# Patient Record
Sex: Male | Born: 1975 | Race: Black or African American | Hispanic: No | Marital: Single | Smoking: Current every day smoker
Health system: Southern US, Community
[De-identification: ages and names within clinical notes are randomized; demographics above are authoritative.]

## PROBLEM LIST (undated history)

## (undated) DIAGNOSIS — F32A Depression, unspecified: Secondary | ICD-10-CM

## (undated) DIAGNOSIS — F329 Major depressive disorder, single episode, unspecified: Secondary | ICD-10-CM

---

## 1999-10-27 ENCOUNTER — Ambulatory Visit (HOSPITAL_BASED_OUTPATIENT_CLINIC_OR_DEPARTMENT_OTHER): Admission: RE | Admit: 1999-10-27 | Discharge: 1999-10-27 | Payer: Self-pay | Admitting: Orthopedic Surgery

## 2000-04-26 ENCOUNTER — Ambulatory Visit (HOSPITAL_BASED_OUTPATIENT_CLINIC_OR_DEPARTMENT_OTHER): Admission: RE | Admit: 2000-04-26 | Discharge: 2000-04-26 | Payer: Self-pay | Admitting: Orthopedic Surgery

## 2003-11-27 ENCOUNTER — Emergency Department (HOSPITAL_COMMUNITY): Admission: EM | Admit: 2003-11-27 | Discharge: 2003-11-27 | Payer: Self-pay | Admitting: Emergency Medicine

## 2003-11-29 ENCOUNTER — Emergency Department (HOSPITAL_COMMUNITY): Admission: EM | Admit: 2003-11-29 | Discharge: 2003-11-30 | Payer: Self-pay | Admitting: Emergency Medicine

## 2003-12-01 ENCOUNTER — Emergency Department (HOSPITAL_COMMUNITY): Admission: EM | Admit: 2003-12-01 | Discharge: 2003-12-01 | Payer: Self-pay | Admitting: Emergency Medicine

## 2005-03-30 ENCOUNTER — Emergency Department (HOSPITAL_COMMUNITY): Admission: EM | Admit: 2005-03-30 | Discharge: 2005-03-30 | Payer: Self-pay | Admitting: Emergency Medicine

## 2005-04-29 ENCOUNTER — Emergency Department (HOSPITAL_COMMUNITY): Admission: EM | Admit: 2005-04-29 | Discharge: 2005-04-29 | Payer: Self-pay | Admitting: Emergency Medicine

## 2006-05-03 ENCOUNTER — Emergency Department (HOSPITAL_COMMUNITY): Admission: EM | Admit: 2006-05-03 | Discharge: 2006-05-03 | Payer: Self-pay | Admitting: Family Medicine

## 2012-03-06 ENCOUNTER — Encounter (HOSPITAL_COMMUNITY): Payer: Self-pay | Admitting: *Deleted

## 2012-03-06 ENCOUNTER — Emergency Department (HOSPITAL_COMMUNITY)
Admission: EM | Admit: 2012-03-06 | Discharge: 2012-03-07 | Disposition: A | Discharge status: Other Acute Inpatient Hospital | Payer: Medicaid Other | Source: Home / Self Care | Attending: Emergency Medicine | Admitting: Emergency Medicine

## 2012-03-06 DIAGNOSIS — F3289 Other specified depressive episodes: Secondary | ICD-10-CM | POA: Insufficient documentation

## 2012-03-06 DIAGNOSIS — F329 Major depressive disorder, single episode, unspecified: Secondary | ICD-10-CM | POA: Insufficient documentation

## 2012-03-06 DIAGNOSIS — T1491XA Suicide attempt, initial encounter: Secondary | ICD-10-CM

## 2012-03-06 DIAGNOSIS — R45851 Suicidal ideations: Secondary | ICD-10-CM | POA: Insufficient documentation

## 2012-03-06 DIAGNOSIS — F322 Major depressive disorder, single episode, severe without psychotic features: Secondary | ICD-10-CM | POA: Diagnosis present

## 2012-03-06 DIAGNOSIS — F17200 Nicotine dependence, unspecified, uncomplicated: Secondary | ICD-10-CM | POA: Insufficient documentation

## 2012-03-06 DIAGNOSIS — X789XXA Intentional self-harm by unspecified sharp object, initial encounter: Secondary | ICD-10-CM | POA: Insufficient documentation

## 2012-03-06 HISTORY — DX: Depression, unspecified: F32.A

## 2012-03-06 HISTORY — DX: Major depressive disorder, single episode, unspecified: F32.9

## 2012-03-06 LAB — COMPREHENSIVE METABOLIC PANEL
ALT: 12 U/L (ref 0–53)
Albumin: 3.9 g/dL (ref 3.5–5.2)
Alkaline Phosphatase: 87 U/L (ref 39–117)
BUN: 8 mg/dL (ref 6–23)
Chloride: 100 mEq/L (ref 96–112)
Glucose, Bld: 86 mg/dL (ref 70–99)
Potassium: 3.7 mEq/L (ref 3.5–5.1)
Total Bilirubin: 0.4 mg/dL (ref 0.3–1.2)

## 2012-03-06 LAB — ETHANOL: Alcohol, Ethyl (B): <11 mg/dL (ref 0–11)

## 2012-03-06 LAB — CBC
HCT: 42.1 % (ref 39.0–52.0)
Hemoglobin: 14.6 g/dL (ref 13.0–17.0)
RDW: 13 % (ref 11.5–15.5)
WBC: 13.6 10*3/uL — ABNORMAL HIGH (ref 4.0–10.5)

## 2012-03-06 LAB — ACETAMINOPHEN LEVEL: Acetaminophen (Tylenol), Serum: <15 ug/mL (ref 10–30)

## 2012-03-06 MED ORDER — ONDANSETRON HCL 8 MG PO TABS: 4.0000 mg | ORAL_TABLET | Freq: Three times a day (TID) | ORAL | Status: DC | PRN

## 2012-03-06 MED ORDER — ALUM & MAG HYDROXIDE-SIMETH 200-200-20 MG/5ML PO SUSP: 30.0000 mL | ORAL | Status: DC | PRN

## 2012-03-06 MED ORDER — IBUPROFEN 200 MG PO TABS: 600.0000 mg | ORAL_TABLET | Freq: Three times a day (TID) | ORAL | Status: DC | PRN

## 2012-03-06 MED ORDER — NICOTINE 21 MG/24HR TD PT24: 21.0000 mg | MEDICATED_PATCH | Freq: Every day | TRANSDERMAL | Status: DC
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
## 2012-03-06 NOTE — ED Notes (Signed)
House coverage called sitter at 11:00, security paged for waunding.

## 2012-03-06 NOTE — ED Notes (Signed)
Belongings in locker #3 

## 2012-03-06 NOTE — ED Notes (Signed)
Pt states that he is ready for everything to be over. Pt states that he tried to cut himself with a razor blade this afternoon but he couldn't hit his vessel. Pt has an abbrassion to his right inner elbow., pt bleeding controlled without bandage. Pt deneis HI. Pt just wants someone to talk to about mental health issues.

## 2012-03-06 NOTE — ED Notes (Signed)
Patient transported to room C22 with this RN.

## 2012-03-06 NOTE — ED Provider Notes (Signed)
History     CSN: [id number]  Arrival date & time 03/06/12  2120   None     Chief Complaint  Patient presents with  . Suicidal    (Consider location/radiation/quality/duration/timing/severity/associated sxs/prior treatment) HPI History provided by pt.   Pt attempted suicide but cutting right antecubital space w/ razor today.  Stopped in the process because of the pain.  Has had SI for a very long time but wife recently cheated on him which pushed him over the edge.  Denies HI.  Has taken zoloft for depression in the past but is not currently medicated and has not been diagnosed w/ any other psychiatric illnesses.  Drinks and uses cocaine occasionally.   Past Medical History  Diagnosis Date  . Depression     History reviewed. No pertinent past surgical history.  History reviewed. No pertinent family history.  History  Substance Use Topics  . Smoking status: Current Every Day Smoker  . Smokeless tobacco: Not on file  . Alcohol Use: Yes      Review of Systems  All other systems reviewed and are negative.    Allergies  Review of patient's allergies indicates no known allergies.  Home Medications  No current outpatient prescriptions on file.  BP 133/95  Pulse 97  Temp 98.5 F (36.9 C) (Oral)  Resp 20  SpO2 95%  Physical Exam  Nursing note and vitals reviewed. Constitutional: He is oriented to person, place, and time. He appears well-developed and well-nourished. No distress.  HENT:  Head: Normocephalic and atraumatic.  Eyes:       Normal appearance  Neck: Normal range of motion.  Pulmonary/Chest: Effort normal.  Musculoskeletal: Normal range of motion.  Neurological: He is alert and oriented to person, place, and time.  Psychiatric: His behavior is normal.       Flat affect.  Pt makes very little eye contact    ED Course  Procedures (including critical care time)  Labs Reviewed  CBC - Abnormal; Notable for the following:    WBC 13.6 (*)     All  other components within normal limits  SALICYLATE LEVEL - Abnormal; Notable for the following:    Salicylate Lvl <2.0 (*)     All other components within normal limits  ACETAMINOPHEN LEVEL  COMPREHENSIVE METABOLIC PANEL  ETHANOL  URINE RAPID DRUG SCREEN (HOSP PERFORMED)   No results found.   1. Suicide attempt       MDM  36yo M presents w/ c/o suicide attempt.  Cleared medically, moved to POD C, ACT team consulted, holding orders written.  Dr. Rulon Abide aware of pt.          Otilio Miu, [place] 03/07/12 [phone]

## 2012-03-07 ENCOUNTER — Inpatient Hospital Stay (HOSPITAL_COMMUNITY)
Admission: EM | Admit: 2012-03-07 | Discharge: 2012-03-09 | DRG: 881 | Disposition: A | Discharge status: Home / Self Care | Payer: Medicaid Other | Source: Ambulatory Visit | Attending: Psychiatry | Admitting: Psychiatry

## 2012-03-07 ENCOUNTER — Encounter (HOSPITAL_COMMUNITY): Payer: Self-pay | Admitting: Behavioral Health

## 2012-03-07 DIAGNOSIS — R45851 Suicidal ideations: Secondary | ICD-10-CM

## 2012-03-07 DIAGNOSIS — F322 Major depressive disorder, single episode, severe without psychotic features: Secondary | ICD-10-CM | POA: Diagnosis present

## 2012-03-07 DIAGNOSIS — F329 Major depressive disorder, single episode, unspecified: Principal | ICD-10-CM

## 2012-03-07 DIAGNOSIS — F3289 Other specified depressive episodes: Principal | ICD-10-CM | POA: Diagnosis present

## 2012-03-07 DIAGNOSIS — Z23 Encounter for immunization: Secondary | ICD-10-CM

## 2012-03-07 DIAGNOSIS — Z79899 Other long term (current) drug therapy: Secondary | ICD-10-CM

## 2012-03-07 LAB — RAPID URINE DRUG SCREEN, HOSP PERFORMED
Barbiturates: NOT DETECTED
Benzodiazepines: NOT DETECTED
Tetrahydrocannabinol: NOT DETECTED

## 2012-03-07 MED ORDER — SERTRALINE HCL 25 MG PO TABS
25.0000 mg | ORAL_TABLET | Freq: Every day | ORAL | Status: DC
  Administered 2012-03-07 – 2012-03-09 (×3): 25 mg via ORAL
  Filled 2012-03-07 (×2): qty 1
  Filled 2012-03-07: qty 3
  Filled 2012-03-07 (×3): qty 1

## 2012-03-07 MED ORDER — ALUM & MAG HYDROXIDE-SIMETH 200-200-20 MG/5ML PO SUSP: 30.0000 mL | ORAL | Status: DC | PRN

## 2012-03-07 MED ORDER — MAGNESIUM HYDROXIDE 400 MG/5ML PO SUSP: 30.0000 mL | Freq: Every day | ORAL | Status: DC | PRN

## 2012-03-07 MED ORDER — INFLUENZA VIRUS VACC SPLIT PF IM SUSP
0.5000 mL | INTRAMUSCULAR | Status: AC
  Administered 2012-03-08: 0.5 mL via INTRAMUSCULAR

## 2012-03-07 MED ORDER — ACETAMINOPHEN 325 MG PO TABS
650.0000 mg | ORAL_TABLET | Freq: Four times a day (QID) | ORAL | Status: DC | PRN
Start: 2012-03-07 — End: 2012-03-09

## 2012-03-07 MED ORDER — TRAZODONE HCL 50 MG PO TABS: 50.0000 mg | ORAL_TABLET | Freq: Every evening | ORAL | Status: DC | PRN

## 2012-03-07 MED ORDER — ACETAMINOPHEN 325 MG PO TABS: 650.0000 mg | ORAL_TABLET | Freq: Four times a day (QID) | ORAL | Status: DC | PRN

## 2012-03-07 NOTE — Progress Notes (Signed)
Psychoeducational Group Note  Date:  03/07/2012 Time:  2000  Group Topic/Focus:  Wrap-Up Group:   The focus of this group is to help patients review their daily goal of treatment and discuss progress on daily workbooks.  Participation Level:  Minimal  Participation Quality:  Attentive  Affect:  Appropriate  Cognitive:  Appropriate  Insight:  Limited  Engagement in Group:  Limited  Additional Comments:  Patient shared that he had not been in the hospital for very long but that he looked forward to groups tomorrow.  Kashden Deboy, [place] 03/07/2012, 10:27 PM
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
## 2012-03-07 NOTE — [facility] Suicide Risk Assessment (Signed)
Suicide Risk Assessment  Admission Assessment     Nursing information obtained from:  Patient Demographic factors:  Male, african [place], married, unemployed Current Mental Status:   Patient upset, tearful over his life. However denies active suicidal thoughts, denies AH/VH/HI. Loss Factors:   Loss of relationship with wife Historical Factors:   Depressed mood Risk Reduction Factors:   Has 4 children that he cares about  CLINICAL FACTORS:   Depression:   Anhedonia Hopelessness Impulsivity Insomnia  COGNITIVE FEATURES THAT CONTRIBUTE TO RISK:  Thought constriction (tunnel vision)    SUICIDE RISK:   Mild:  Suicidal ideation of limited frequency, intensity, duration, and specificity.  There are no identifiable plans, no associated intent, mild dysphoria and related symptoms, good self-control (both objective and subjective assessment), few other risk factors, and identifiable protective factors, including available and accessible social support.  PLAN OF CARE: Start antidepressant medication. Continue to assess for SI.  Kyle Vance 03/07/2012, 1:38 PM

## 2012-03-07 NOTE — ED Notes (Addendum)
Pt resting in bed, watching TV, no distress noted, awaiting ACT team consult. Sitter remains at bedside.

## 2012-03-07 NOTE — Tx Team (Signed)
Initial Interdisciplinary Treatment Plan  PATIENT STRENGTHS: (choose at least two) Ability for insight General fund of knowledge Motivation for treatment/growth Physical Health  PATIENT STRESSORS: Educational concerns Marital or family conflict   PROBLEM LIST: Problem List/Patient Goals Date to be addressed Date deferred Reason deferred Estimated date of resolution  Suicidal Ideations      Depression                                                 DISCHARGE CRITERIA:  Motivation to continue treatment in a less acute level of care Need for constant or close observation no longer present  PRELIMINARY DISCHARGE PLAN: Attend aftercare/continuing care group Outpatient therapy  PATIENT/FAMIILY INVOLVEMENT: This treatment plan has been presented to and reviewed with the patient, Kyle Vance, and/or family member, .  The patient and family have been given the opportunity to ask questions and make suggestions.  Noah Charon 03/07/2012, 4:05 PM

## 2012-03-07 NOTE — BH Assessment (Signed)
[facility] Assessment Progress Note      Pt has been accepted for inpatient psychiatric treatment to Dr. Daleen Bo.  All support paperwork completed and faxed to [facility].  Dr. Jeraldine Loots and nursing staff notified and agreeable with disposition.  Awaiting transfer.

## 2012-03-07 NOTE — ED Provider Notes (Signed)
Medical screening examination/treatment/procedure(s) were performed by non-physician practitioner and as supervising physician I was immediately available for consultation/collaboration.  Jones Skene, M.D.   r   Jones Skene, MD 03/07/12 [phone]
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
## 2012-03-07 NOTE — ED Notes (Signed)
Report received, assumed care.  
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
## 2012-03-08 NOTE — Progress Notes (Signed)
Patient ID: Kyle Vance, male   DOB: [birth year], 36 y.o.   MRN: [id number] D: Pt. Reports "I'm cool"  "they going to let me out tomorrow."  Pt. Reports he's gained some coping skills and it going to get therapy upon discharge. A: Pt. Encouraged to go to [employer]. Staff will monitor for safety q38min.  Writer encouraged pt. To approach marital conflict with caution and to consider counseling. R: Pt. Is safe on the unit. Pt. Attends karaoke. Pt. Is still considering what to do about marriage, but children is of the utmost importance.

## 2012-03-08 NOTE — Progress Notes (Signed)
Patient ID: Kyle Vance, male   DOB: 1976-02-11, 36 y.o.   MRN: [id number] D: Pt. Under the impression he would be discharged tonight. "I signed myself in voluntarily though." Pt. Denies SHI, reports put razor to arm to cut self "I just couldn't do it, couldn't go deep"  Pt. Concerned about test he is scheduled to take in the morning. 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  Pt. Reports does want to miss any more classes. Pt. Reports family issues but does not elaborate "but she gone now, she packed up and left."  A: Staff will monitor q73min for safety. Pt.encouraged to go to group. Writer encouraged pt. To speak with physician in a.m. About discharge and ask CM about getting a letter to the school so he is not dropped from the program. R: Pt. Attended group. Pt. Is safe on the unit. Pt. Plans to speak with physician in a.m.

## 2012-03-08 NOTE — Progress Notes (Signed)
Psychoeducational Group Note  Date:  03/08/2012 Time: 1100  Group Topic/Focus:  Rediscovering Joy:   The focus of this group is to explore various ways to relieve stress in a positive manner.  Participation Level:  Active  Participation Quality:  Appropriate, Attentive and Sharing  Affect:  Appropriate  Cognitive:  Appropriate  Insight:  Good  Engagement in Group:  Good  Additional Comments:  Patient was able to explain different ways humor and laughter have positive benefits for both mind and body. Patient was able to identify one thing that is pleasurable to patient within the five senses in a category and use the information as coping skills when needed.  Karleen Hampshire Brittini 03/08/2012, 3:14 PM

## 2012-03-08 NOTE — Progress Notes (Signed)
Psychoeducational Group Note  Date:  03/08/2012 Time:  1000  Group Topic/Focus:  Wellness Toolbox:   The focus of this group is to discuss various aspects of wellness, balancing those aspects and exploring ways to increase the ability to experience wellness.  Patients will create a wellness toolbox for use upon discharge.  Participation Level:  Active  Participation Quality:  Appropriate, Attentive, Sharing and Supportive  Affect:  Appropriate  Cognitive:  Alert and Appropriate  Insight:  Good  Engagement in Group:  Good  Additional Comments:  Productive group  Earline Mayotte 03/08/2012, 6:21 PM
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
## 2012-03-08 NOTE — Treatment Plan (Signed)
Interdisciplinary Treatment Plan Update (Adult)  Date: 03/08/2012  Time Reviewed: 9:33 AM   Progress in Treatment: Attending groups: Yes Participating in groups: Yes Taking medication as prescribed: Yes Tolerating medication: Yes   Family/Significant other contact made:  Yes Patient understands diagnosis:  Yes  As evidenced by asking for help with depression, increase coping skills Discussing patient identified problems/goals with staff:  Yes See below Medical problems stabilized or resolved:  Yes Denies suicidal/homicidal ideation: Yes  In AM group Issues/concerns per patient self-inventory:  None  Rates depression and hopelessness a 1 Other:  New problem(s) identified: N/A  Reason for Continuation of Hospitalization: Medication stabilization  Interventions implemented related to continuation of hospitalization:  Zoloft trial  Encourage group attendance and participation  Additional comments:  Estimated length of stay: 1-2 days  Discharge Plan: return home, follow up outpt  New goal(s): N/A  Review of initial/current patient goals per problem list:    1.  Goal(s):Eliminate SI  Met:  Yes  Target date:10/31  As evidenced by Self report  2.  Goal (s): Stabilize mood  Met:  Yes  Target date:10/31  As evidenced by:Will rate depression and anxiety at 3 or less  3.  Goal(s): Set up for outpt services  Met:  Yes  Target date:10/31  As evidenced JY:[id number] of appointments  4.  Goal(s):  Met:  Yes  Target date:  As evidenced by:  Attendees: Patient:     Family:     Physician: Dr Daleen Bo 03/08/2012 9:33 AM   Nursing:  Berneice Heinrich  03/08/2012 9:33 AM   Clinical Social Worker:  Richelle Ito 03/08/2012 9:33 AM   Extender:   03/08/2012 9:33 AM   Other:     Other:     Other:     Other:      Scribe for Treatment Team:   Ida Rogue, 03/08/2012 9:33 AM

## 2012-03-08 NOTE — Progress Notes (Signed)
[facility] MD Progress Note  03/08/2012 10:37 AM Kyle Vance  MRN:  [id number]  S: Patient seen. Reports doing okay, spoke to his family last night. Tolerating Zoloft well, denies side effects. Denies SI.  Diagnosis:   Axis I: Depressive Disorder NOS Axis II: No diagnosis Axis III:  Past Medical History  Diagnosis Date  . Depression    Axis IV: economic problems, housing problems and occupational problems Axis V: 51-60 moderate symptoms  ADL's:  Intact  Sleep: Fair  Appetite:  Fair  Mental Status Examination/Evaluation: Objective:  Appearance: Disheveled  Patent attorney::  Fair  Speech:  Clear and Coherent  Volume:  Normal  Mood:  Anxious  Affect:  Blunt  Thought Process:  Coherent  Orientation:  Full  Thought Content:  WDL  Suicidal Thoughts:  No  Homicidal Thoughts:  No  Memory:  Immediate;   Fair Recent;   Fair Remote;   Fair  Judgement:  Fair  Insight:  Fair  Psychomotor Activity:  Normal  Concentration:  Fair  Recall:  Fair  Akathisia:  No  Handed:  Right  AIMS (if indicated):     Assets:  Communication Skills Desire for Improvement  Sleep:  Number of Hours: 5.5    Vital Signs:Blood pressure 95/63, pulse 87, temperature 98.1 F (36.7 C), temperature source Oral, resp. rate 16, height 5\' 11"  (1.803 m), weight 106.142 kg (234 lb). Current Medications: Current Facility-Administered Medications  Medication Dose Route Frequency Provider Last Rate Last Dose  . acetaminophen (TYLENOL) tablet 650 mg  650 mg Oral Q6H PRN Norval Gable, NP      . alum & mag hydroxide-simeth (MAALOX/MYLANTA) 200-200-20 MG/5ML suspension 30 mL  30 mL Oral Q4H PRN Norval Gable, NP      . influenza  inactive virus vaccine (FLUZONE/FLUARIX) injection 0.5 mL  0.5 mL Intramuscular Tomorrow-1000 Namya Voges, MD      . magnesium hydroxide (MILK OF MAGNESIA) suspension 30 mL  30 mL Oral Daily PRN Norval Gable, NP      . sertraline (ZOLOFT) tablet 25 mg  25 mg Oral Daily  Franklyn Cafaro, MD   25 mg at 03/08/12 0730  . traZODone (DESYREL) tablet 50 mg  50 mg Oral QHS PRN Tarik Teixeira, MD      . DISCONTD: acetaminophen (TYLENOL) tablet 650 mg  650 mg Oral Q6H PRN Arseniy Toomey, MD      . DISCONTD: alum & mag hydroxide-simeth (MAALOX/MYLANTA) 200-200-20 MG/5ML suspension 30 mL  30 mL Oral Q4H PRN Lynley Killilea, MD      . DISCONTD: magnesium hydroxide (MILK OF MAGNESIA) suspension 30 mL  30 mL Oral Daily PRN Kathren Scearce, MD       Facility-Administered Medications Ordered in Other Encounters  Medication Dose Route Frequency Provider Last Rate Last Dose  . DISCONTD: alum & mag hydroxide-simeth (MAALOX/MYLANTA) 200-200-20 MG/5ML suspension 30 mL  30 mL Oral PRN Otilio Miu, PA      . DISCONTD: ibuprofen (ADVIL,MOTRIN) tablet 600 mg  600 mg Oral Q8H PRN Otilio Miu, PA      . DISCONTD: nicotine (NICODERM CQ - dosed in mg/24 hours) patch 21 mg  21 mg Transdermal Daily Otilio Miu, PA      . DISCONTD: ondansetron (ZOFRAN) tablet 4 mg  4 mg Oral Q8H PRN Otilio Miu, PA        Lab Results:  Results for orders placed during the hospital encounter of 03/06/12 (from the past 48 hour(s))  ACETAMINOPHEN LEVEL  Status: Normal   Collection Time   03/06/12 10:20 PM      Component Value Range Comment   Acetaminophen (Tylenol), Serum <15.0  10 - 30 ug/mL   CBC     Status: Abnormal   Collection Time   03/06/12 10:20 PM      Component Value Range Comment   WBC 13.6 (*) 4.0 - 10.5 K/uL    RBC 4.74  4.22 - 5.81 MIL/uL    Hemoglobin 14.6  13.0 - 17.0 g/dL    HCT 16.1  09.6 - 04.5 %    MCV 88.8  78.0 - 100.0 fL    MCH 30.8  26.0 - 34.0 pg    MCHC 34.7  30.0 - 36.0 g/dL    RDW 40.9  81.1 - 91.4 %    Platelets 337  150 - 400 K/uL   COMPREHENSIVE METABOLIC PANEL     Status: Normal   Collection Time   03/06/12 10:20 PM      Component Value Range Comment   Sodium 137  135 - 145 mEq/L    Potassium 3.7  3.5 - 5.1 mEq/L     Chloride 100  96 - 112 mEq/L    CO2 25  19 - 32 mEq/L    Glucose, Bld 86  70 - 99 mg/dL    BUN 8  6 - 23 mg/dL    Creatinine, Ser 7.82  0.50 - 1.35 mg/dL    Calcium 9.2  8.4 - 95.6 mg/dL    Total Protein 7.5  6.0 - 8.3 g/dL    Albumin 3.9  3.5 - 5.2 g/dL    AST 12  0 - 37 U/L    ALT 12  0 - 53 U/L    Alkaline Phosphatase 87  39 - 117 U/L    Total Bilirubin 0.4  0.3 - 1.2 mg/dL    GFR calc non Af Amer >90  >90 mL/min    GFR calc Af Amer >90  >90 mL/min   ETHANOL     Status: Normal   Collection Time   03/06/12 10:20 PM      Component Value Range Comment   Alcohol, Ethyl (B) <11  0 - 11 mg/dL   SALICYLATE LEVEL     Status: Abnormal   Collection Time   03/06/12 10:20 PM      Component Value Range Comment   Salicylate Lvl <2.0 (*) 2.8 - 20.0 mg/dL   URINE RAPID DRUG SCREEN (HOSP PERFORMED)     Status: Normal   Collection Time   03/07/12 12:05 AM      Component Value Range Comment   Opiates NONE DETECTED  NONE DETECTED    Cocaine NONE DETECTED  NONE DETECTED    Benzodiazepines NONE DETECTED  NONE DETECTED    Amphetamines NONE DETECTED  NONE DETECTED    Tetrahydrocannabinol NONE DETECTED  NONE DETECTED    Barbiturates NONE DETECTED  NONE DETECTED     Physical Findings: AIMS: Facial and Oral Movements Muscles of Facial Expression: None, normal Lips and Perioral Area: None, normal Jaw: None, normal Tongue: None, normal,Extremity Movements Upper (arms, wrists, hands, fingers): None, normal Lower (legs, knees, ankles, toes): None, normal, Trunk Movements Neck, shoulders, hips: None, normal, Overall Severity Severity of abnormal movements (highest score from questions above): None, normal Incapacitation due to abnormal movements: None, normal Patient's awareness of abnormal movements (rate only patient's report): No Awareness, Dental Status Current problems with teeth and/or dentures?: No Does patient usually wear  dentures?: No  CIWA:    COWS:     Treatment Plan  Summary: Daily contact with patient to assess and evaluate symptoms and progress in treatment Medication management  Plan: Continue current plan of care. Plan for discharge tomorrow with appropriate follow up appointments.  [name] 03/08/2012, 10:37 AM

## 2012-03-08 NOTE — Discharge Planning (Signed)
Pt attended morning aftercare planning group.Pt displayed appropriate affect and was attentive and insightful during group. He reported that it was his idea to come to hospital and is seeking help with learning better coping skills and working on self-esteem issues stemming from negative childhood experiences with father. Pt said that he kicked wife out of home this past Sat after her alleged infidelity. He lives with grandmother and daughter and is a Consulting civil engineer at [employer]). Pt reported that this is his first time in a behavioral health hospital. PSA completed. Pt signed consent for SW to speak with his mother. Pt open to receiving therapy services at [facility] and will follow up at [facility]. Message left for intake person at [facility] to schedule appointment with therapist. Pt would also like a letter for school explaining his absence.  Kyle Vance attended PM group.  He entered late, and did not contribute to the discussion.

## 2012-03-08 NOTE — Progress Notes (Signed)
Psychoeducational Group Note  Date:  03/08/2012 Time:  2100   Group Topic/Focus:  Karaoke  Participation Level:  Minimal  Participation Quality:  Attentive  Affect:  Appropriate  Cognitive:  Appropriate  Insight:  Good  Engagement in Group:  Limited  Additional Comments:    Humberto Seals Monique 03/08/2012, 10:11 PM

## 2012-03-08 NOTE — Progress Notes (Addendum)
[facility] Adult Inpatient Family/Significant Other Suicide Prevention Education  Suicide Prevention Education:  Education Completed; [place] Daily,  Mother, [phone], has been identified by the patient as the family member/significant other with whom the patient will be residing, and identified as the person(s) who will aid the patient in the event of a mental health crisis (suicidal ideations/suicide attempt).  With written consent from the patient, the family member/significant other has been provided the following suicide prevention education, prior to the and/or following the discharge of the patient.  The suicide prevention education provided includes the following:  Suicide risk factors  Suicide prevention and interventions  National Suicide Hotline telephone number  [facility] assessment telephone number  [facility] Emergency Assistance 911  [facility] and/or Residential Mobile Crisis Unit telephone number  Request made of family/significant other to:  Remove weapons (e.g., guns, rifles, knives), all items previously/currently identified as safety concern.    Remove drugs/medications (over-the-counter, prescriptions, illicit drugs), all items previously/currently identified as a safety concern.  The family member/significant other verbalizes understanding of the suicide prevention education information provided.  The family member/significant other agrees to remove the items of safety concern listed above.  Kyle Vance does not live with his mother, but did not give [place] permission to contact grandmother, with whom he lives.  Kyle Vance knows her son well, and informed me that part of what she believes is going on is that she just learned that her cancer has come back, and she believes that weighs heavily on Kyle Vance's mind as well.  Daryel Gerald B 03/08/2012, 5:38 PM

## 2012-03-08 NOTE — Progress Notes (Signed)
  D) Patient quiet but  cooperative upon my assessment. Patient states slept "well," and  appetite is "good." Patient rates depression as   1/10, patient rates hopeless feelings as 1 /10. Patient denies SI/HI, denies A/V hallucinations. Patient states "I am ready to get back to my life, I don't want to hurt myself I have too many things going right for me."   A) Patient offered support and encouragement, patient encouraged to discuss feelings/concerns with staff. Patient verbalized understanding. Patient monitored Q15 minutes for safety. Patient met with MD to discuss today's goals and plan of care.  R) Patient active on unit, attending groups in day room and meals in dining room.  Patient insightful with a plan to "keep working on coping skills and find someone to talk to" once he is discharged from [facility]. Patient taking medications as ordered. Will continue to monitor.

## 2012-03-09 MED ORDER — SERTRALINE HCL 25 MG PO TABS: 25.0000 mg | ORAL_TABLET | Freq: Every day | ORAL | Status: DC

## 2012-03-09 NOTE — Progress Notes (Signed)
Patient denies SI/HI, denies A/V hallucinations. Patient verbalizes understanding of discharge instructions, follow up care and prescriptions. Patient given all belongings from [facility] locker. Patient escorted out by staff, transported by family. 

## 2012-03-09 NOTE — Progress Notes (Signed)
Psychoeducational Group Note  Date:  03/09/2012 Time:  1100  Group Topic/Focus:  Early Warning Signs:   The focus of this group is to help patients identify signs or symptoms they exhibit before slipping into an unhealthy state or crisis.  Participation Level:  Active  Participation Quality:  Appropriate and Attentive  Affect:  Appropriate  Cognitive:  Alert, Appropriate and Oriented  Insight:  Good  Engagement in Group:  Good  Additional Comments:    Noah Charon 03/09/2012, 2:15 PM

## 2012-03-09 NOTE — Progress Notes (Signed)
[facility] Case Management Discharge Plan:  Will you be returning to the same living situation after discharge: Yes,  home with family At discharge, do you have transportation home?:Yes,  family Do you have the ability to pay for your medications:Yes,  mental health  Interagency Information:     Release of information consent forms completed and in the chart;  Patient's signature needed at discharge.  Patient to Follow up at:  Follow-up Information    Follow up with [facility]. (Walk-in between 8am-9am Monday thru Friday for your hospital follow up appointment.  this is where you will see a Dr for your medication)    Contact information:   [address], [place] [address] [phone] fax [phone]      Follow up with [facility]. On 03/15/2012. (Appointment at 3pm to see Tamera Punt (therapist). Please arrive by 2:45pm to fill out paperwork. Make sure to call after you discharge to confirm appointment.)    Contact information:   [facility] [address] [place] [address] [phone] fax [phone]         Patient denies SI/HI:   Yes,  yes    Safety Planning and Suicide Prevention discussed:  Yes,  yes  Barrier to discharge identified:No.  Summary and Recommendations:   Kyle Vance 03/09/2012, 10:57 AM

## 2012-03-09 NOTE — [facility] Suicide Risk Assessment (Signed)
Suicide Risk Assessment  Discharge Assessment     Demographic Factors:  Male and Unemployed  Mental Status Per Nursing Assessment::   On Admission:     Current Mental Status by Physician: Patient alert and oriented to 4. Mood improved. Denies SI/HI/AH/VH.  Loss Factors: Decrease in vocational status, Loss of significant relationship and Financial problems/change in socioeconomic status  Historical Factors: Impulsivity  Risk Reduction Factors:   Responsible for children under 59 years of age, Sense of responsibility to family and Positive coping skills or problem solving skills  Continued Clinical Symptoms:  Depression:   Recent sense of peace/wellbeing  Cognitive Features That Contribute To Risk:  Cognitively intact  Suicide Risk:  Minimal: No identifiable suicidal ideation.  Patients presenting with no risk factors but with morbid ruminations; may be classified as minimal risk based on the severity of the depressive symptoms  Discharge Diagnoses:   AXIS I:  Depressive Disorder NOS AXIS II:  No diagnosis AXIS III:   Past Medical History  Diagnosis Date  . Depression    AXIS IV:  economic problems, housing problems and occupational problems AXIS V:  61-70 mild symptoms  Plan Of Care/Follow-up recommendations:  Activity:  normal Diet:  normal  Is patient on multiple antipsychotic therapies at discharge:  No   Has Patient had three or more failed trials of antipsychotic monotherapy by history:  No  Recommended Plan for Multiple Antipsychotic Therapies: NA  Bran Aldridge 03/09/2012, 10:37 AM

## 2012-03-12 NOTE — Progress Notes (Signed)
Patient Discharge Instructions:  After Visit Summary (AVS):   Faxed to:  03/12/2012 Psychiatric Admission Assessment Note:   Faxed to:  03/12/2012 Suicide Risk Assessment - Discharge Assessment:   Faxed to:  03/12/2012 Faxed/Sent to the Next Level Care provider:  03/12/2012  Faxed to [facility] @ [phone] And to [facility] @ [phone]  Wandra Scot, 03/12/2012, 4:31 PM

## 2012-03-13 NOTE — Discharge Summary (Signed)
Physician Discharge Summary Note  Patient:  Kyle Vance is an 36 y.o., male MRN:  [id number] DOB:  Jan 11, 1976 Patient phone:  [phone] (home)  Patient address:   [address] [place] [address]   Date of Admission:  03/07/2012 Date of Discharge: 03/09/2012  Discharge Diagnoses: Depressive d/o nos   Axis Diagnosis: AXIS I: Depressive Disorder NOS  AXIS II: No diagnosis  AXIS III:  Past Medical History   Diagnosis  Date   .  Depression     AXIS IV: economic problems, occupational problems and other psychosocial or environmental problems  AXIS V: 51-60 moderate symptoms   Level of Care:  Inpatient Hospitalization.  Reason for hospitalization: Pt came to [facility] because he had cut himself with a knife in an effort "to cut a main blood vessel and bleed to death." Patient admits that purpose of cutting was to kill self. Patient had been waiting to meet someone yesterday. When that person did not show up patient went into the woods and made a cut to himself.Moved recently from [place] for a better life. Patient and wife are recently separated over the last few days. Patient feels that he fails at everything that he attempts. He states, "I get tired of losing." Patient denies any HI or A/V hallucinations. 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 He reports that neither of these substances are used on a regular basis. Patient has no outpatient psychiatric care and no previous inpatient history. He does talk a lot about the way that his father treats him and puts him down. Patient states that the reason for separation from spouse is because of infidelity.  Hospital Course:   The patient attended treatment team meeting this am and met with treatment team members. The patient's symptoms, treatment plan and response to treatment was discussed. The patient endorsed that their symptoms have improved. The patient also stated that they felt stable for discharge.  They reported  that from this hospital stay they had learned many coping skills.  In other to maintain their psychiatric stability, they will continue psychiatric care on an outpatient basis. They will follow-up as outlined below.  In addition they were instructed  to take all your medications as prescribed by their mental healthcare provider and to report any adverse effects and or reactions from your medicines to their outpatient provider promptly.  The patient is also instructed and cautioned to not engage in alcohol and or illegal drug use while on prescription medicines.  In the event of worsening symptoms the patient is instructed to call the crisis hotline, 911 and or go to the nearest ED for appropriate evaluation and treatment of symptoms.   Also while a patient in this hospital, the patient received medication management for his psychiatric symptoms. They were ordered and received as outlined below:    Medication List     As of 03/13/2012 12:18 PM    TAKE these medications      Indication    sertraline 25 MG tablet   Commonly known as: ZOLOFT   Take 1 tablet (25 mg total) by mouth daily. For depression/anxiety    Indication: for depression/anxiety       They were also enrolled in group counseling sessions and activities in which they participated actively.       Follow-up Information    Follow up with [facility]. (Walk-in between 8am-9am Monday thru Friday for your hospital follow up appointment.  this is where you will  see a Dr for your medication)    Contact information:   [address], [place] [address] [phone] fax [phone]      Follow up with [facility]. On 03/15/2012. (Appointment at 3pm to see Tamera Punt (therapist). Please arrive by 2:45pm to fill out paperwork. Make sure to call after you discharge to confirm appointment.)    Contact information:   [facility] [address] [place] [address] [phone] fax [phone]          Upon discharge, patient adamantly denies suicidal, homicidal ideations, auditory, visual hallucinations and or delusional thinking. They left [facility] with all personal belongings via personal transportation in no apparent distress.  Consults:  Please see electronic medical record for details.  Significant Diagnostic Studies:  Please see electronic medical record for details.  Discharge Vitals:   Blood pressure 124/79, pulse 91, temperature 97.7 F (36.5 C), temperature source Oral, resp. rate 16, height 5\' 11"  (1.803 m), weight 106.142 kg (234 lb)..  Mental Status Exam: See Mental Status Examination and Suicide Risk Assessment completed by Attending Physician prior to discharge.  Discharge destination:  Home  Is patient on multiple antipsychotic therapies at discharge:  No  Has Patient had three or more failed trials of antipsychotic monotherapy by history: N/A Recommended Plan for Multiple Antipsychotic Therapies: N/A Discharge Orders    Future Orders Please Complete By Expires   Diet - low sodium heart healthy      Increase activity slowly          Medication List     As of 03/13/2012 12:18 PM    TAKE these medications      Indication    sertraline 25 MG tablet   Commonly known as: ZOLOFT   Take 1 tablet (25 mg total) by mouth daily. For depression/anxiety    Indication: for depression/anxiety           Follow-up Information    Follow up with [facility]. (Walk-in between 8am-9am Monday thru Friday for your hospital follow up appointment.  this is where you will see a Dr for your medication)    Contact information:   [address], [place] [address] [phone] fax [phone]      Follow up with [facility]. On 03/15/2012. (Appointment at 3pm to see Tamera Punt (therapist). Please arrive by 2:45pm to fill out paperwork. Make sure to call after you discharge to confirm appointment.)    Contact information:   [facility] [address] [place] [address] [phone] fax [phone]        Follow-up recommendations:   Activities: Resume typical activities Diet: Resume typical diet Other: Follow up with outpatient provider and report any side effects to out patient prescriber.  Comments:  Take all your medications as prescribed by your mental healthcare provider. Report any adverse effects and or reactions from your medicines to your outpatient provider promptly. Patient is instructed and cautioned to not engage in alcohol and or illegal drug use while on prescription medicines. In the event of worsening symptoms, patient is instructed to call the crisis hotline, 911 and or go to the nearest ED for appropriate evaluation and treatment of symptoms. Follow-up with your primary care provider for your other medical issues, concerns and or health care needs.  SignedPatrick North 03/13/2012 12:18 PM

## 2016-07-23 ENCOUNTER — Encounter (HOSPITAL_COMMUNITY): Payer: Self-pay

## 2016-07-23 ENCOUNTER — Emergency Department (HOSPITAL_COMMUNITY)
Admission: EM | Admit: 2016-07-23 | Discharge: 2016-07-24 | Disposition: A | Discharge status: Home / Self Care | Payer: Managed Care, Other (non HMO) | Attending: Emergency Medicine | Admitting: Emergency Medicine

## 2016-07-23 ENCOUNTER — Emergency Department (HOSPITAL_COMMUNITY): Payer: Managed Care, Other (non HMO)

## 2016-07-23 DIAGNOSIS — Y939 Activity, unspecified: Secondary | ICD-10-CM | POA: Insufficient documentation

## 2016-07-23 DIAGNOSIS — S8992XA Unspecified injury of left lower leg, initial encounter: Secondary | ICD-10-CM | POA: Diagnosis present

## 2016-07-23 DIAGNOSIS — F17210 Nicotine dependence, cigarettes, uncomplicated: Secondary | ICD-10-CM | POA: Diagnosis not present

## 2016-07-23 DIAGNOSIS — W19XXXA Unspecified fall, initial encounter: Secondary | ICD-10-CM

## 2016-07-23 DIAGNOSIS — Y929 Unspecified place or not applicable: Secondary | ICD-10-CM | POA: Diagnosis not present

## 2016-07-23 DIAGNOSIS — X509XXA Other and unspecified overexertion or strenuous movements or postures, initial encounter: Secondary | ICD-10-CM | POA: Diagnosis not present

## 2016-07-23 DIAGNOSIS — S86912A Strain of unspecified muscle(s) and tendon(s) at lower leg level, left leg, initial encounter: Secondary | ICD-10-CM | POA: Diagnosis not present

## 2016-07-23 DIAGNOSIS — S9002XA Contusion of left ankle, initial encounter: Secondary | ICD-10-CM

## 2016-07-23 DIAGNOSIS — Y990 Civilian activity done for income or pay: Secondary | ICD-10-CM | POA: Diagnosis not present

## 2016-07-23 MED ORDER — IBUPROFEN 400 MG PO TABS
600.0000 mg | ORAL_TABLET | Freq: Once | ORAL | Status: AC
  Administered 2016-07-23: 600 mg via ORAL
  Filled 2016-07-23: qty 1

## 2016-07-23 NOTE — Discharge Instructions (Signed)
Tylenol and motrin as needed for pain.  If you were given medicines take as directed.  If you are on coumadin or contraceptives realize their levels and effectiveness is altered by many different medicines.  If you have any reaction (rash, tongues swelling, other) to the medicines stop taking and see a physician.    If your blood pressure was elevated in the ER make sure you follow up for management with a primary doctor or return for chest pain, shortness of breath or stroke symptoms.  Please follow up as directed and return to the ER or see a physician for new or worsening symptoms.  Thank you. Vitals:   07/23/16 2019 07/23/16 2112  BP: 138/85 (!) 154/85  Pulse: 97 85  Resp: 16 16  Temp: 97.7 F (36.5 C)   TempSrc: Oral   SpO2: 98% 94%

## 2016-07-23 NOTE — ED Triage Notes (Addendum)
Pt complaining of L knee and L ankle pain x 3 days. Pt states fell, caught leg underneath self. Pt ambulatory at triage. Full ROM, no obvious swelling noted at triage.

## 2016-07-23 NOTE — ED Provider Notes (Signed)
[facility]-EMERGENCY DEPT Provider Note   CSN: [id number] Arrival date & time: 07/23/16  1948     History   Chief Complaint Chief Complaint  Patient presents with  . Leg Pain    HPI Kyle Vance is a 41 y.o. male.  Patient with no significant medical problems presents with left knee and left ankle pain since an injury at work properly 3 days ago. Patient fell awkwardly and twisted his left knee and injured his left ankle. Pain with walking. No other injuries.      Past Medical History:  Diagnosis Date  . Depression     Patient Active Problem List   Diagnosis Date Noted  . MDD (major depressive disorder), single episode, severe , no psychosis (HCC) 03/07/2012    History reviewed. No pertinent surgical history.     Home Medications    Prior to Admission medications   Not on File    Family History History reviewed. No pertinent family history.  Social History Social History  Substance Use Topics  . Smoking status: Current Every Day Smoker    Packs/day: 1.00    Types: Cigarettes  . Smokeless tobacco: Never Used  . Alcohol use 1.2 oz/week    2 Cans of beer per week     Allergies   Patient has no known allergies.   Review of Systems Review of Systems  Constitutional: Negative for fever.  Musculoskeletal: Negative for joint swelling.  Skin: Negative for rash.  Neurological: Negative for weakness.     Physical Exam Updated Vital Signs BP 119/69   Pulse 67   Temp 97.7 F (36.5 C) (Oral)   Resp 16   SpO2 97%   Physical Exam  Constitutional: He is oriented to person, place, and time. He appears well-developed and well-nourished.  HENT:  Head: Normocephalic and atraumatic.  Eyes: Right eye exhibits no discharge. Left eye exhibits no discharge.  Neck: Normal range of motion.  Cardiovascular: Normal rate and regular rhythm.   Pulmonary/Chest: Effort normal.  Musculoskeletal: He exhibits tenderness. He exhibits no edema or deformity.  Patient  has mild tenderness medial joint line on the left knee. No effusion. No ligament laxity with testing. Mild tenderness lateral malleoli of the left. No swelling to left leg compartment soft. Neurovascularly intact.  Neurological: He is alert and oriented to person, place, and time.  Skin: Skin is warm. No rash noted.  Psychiatric: He has a normal mood and affect.  Nursing note and vitals reviewed.    ED Treatments / Results  Labs (all labs ordered are listed, but only abnormal results are displayed) Labs Reviewed - No data to display  EKG  EKG Interpretation None       Radiology Dg Ankle Complete Left  Result Date: 07/23/2016 CLINICAL DATA:  Fall tonight at work, left knee and ankle pain EXAM: LEFT ANKLE COMPLETE - 3+ VIEW COMPARISON:  None. FINDINGS: There is no evidence of fracture, dislocation, or joint effusion. There is no evidence of arthropathy or other focal bone abnormality. Soft tissues are unremarkable. IMPRESSION: Negative. Electronically Signed   By: Burman Nieves M.D.   On: 07/23/2016 23:15   Dg Knee Complete 4 Views Left  Result Date: 07/23/2016 CLINICAL DATA:  Fall tonight at work, left knee and ankle pain EXAM: LEFT KNEE - COMPLETE 4+ VIEW COMPARISON:  None. FINDINGS: No evidence of fracture, dislocation, or joint effusion. No evidence of arthropathy or other focal bone abnormality. Soft tissues are unremarkable. IMPRESSION: Negative. Electronically Signed  By: Burman NievesWilliam  Stevens M.D.   On: 07/23/2016 23:14    Procedures Procedures (including critical care time)  Medications Ordered in ED Medications  ibuprofen (ADVIL,MOTRIN) tablet 600 mg (600 mg Oral Given 07/23/16 2239)     Initial Impression / Assessment and Plan / ED Course  I have reviewed the triage vital signs and the nursing notes.  Pertinent labs & imaging results that were available during my care of the patient were reviewed by me and considered in my medical decision making (see chart for  details).    Patient presents with clinical concern for bone contusion and mild meniscal/ligamentous injury. Patient can bear weight. Plan for screening x-rays and outpatient follow-up.  Results and differential diagnosis were discussed with the patient/parent/guardian. Xrays were independently reviewed by myself.  Close follow up outpatient was discussed, comfortable with the plan.   Medications  ibuprofen (ADVIL,MOTRIN) tablet 600 mg (600 mg Oral Given 07/23/16 2239)    Vitals:   07/23/16 2019 07/23/16 2112 07/23/16 2300  BP: 138/85 (!) 154/85 119/69  Pulse: 97 85 67  Resp: 16 16   Temp: 97.7 F (36.5 C)    TempSrc: Oral    SpO2: 98% 94% 97%    Final diagnoses:  Fall, initial encounter  Knee strain, left, initial encounter  Contusion of left ankle, initial encounter     Final Clinical Impressions(s) / ED Diagnoses   Final diagnoses:  Fall, initial encounter  Knee strain, left, initial encounter  Contusion of left ankle, initial encounter    New Prescriptions New Prescriptions   No medications on file     Blane OharaJoshua Marshayla Mitschke, MD 07/23/16 2319

## 2018-01-11 IMAGING — CR DG ANKLE COMPLETE 3+V*L*
3 series · 3 of 3 positions shown · non-contrast
Comparison: None.

CLINICAL DATA: Fall tonight at work, left knee and ankle pain

EXAM:
LEFT ANKLE COMPLETE - 3+ VIEW

[AP]
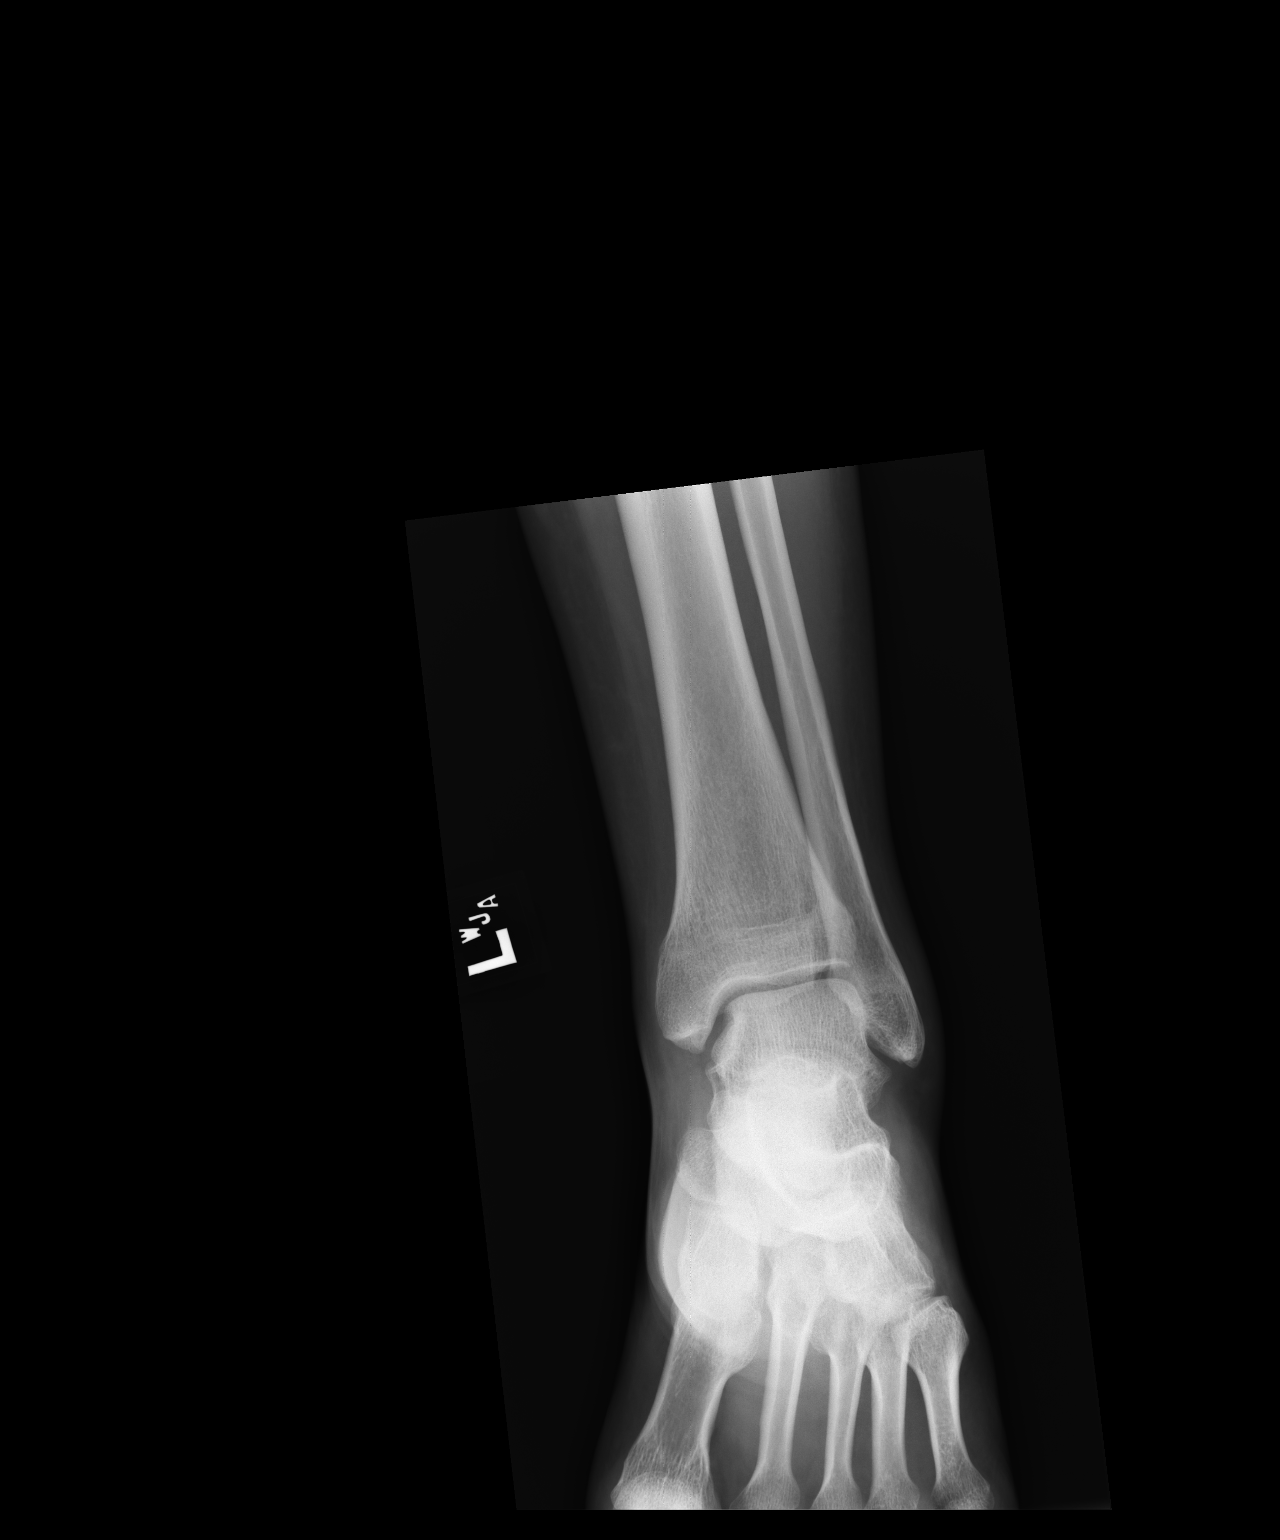

[ap obl int rot]
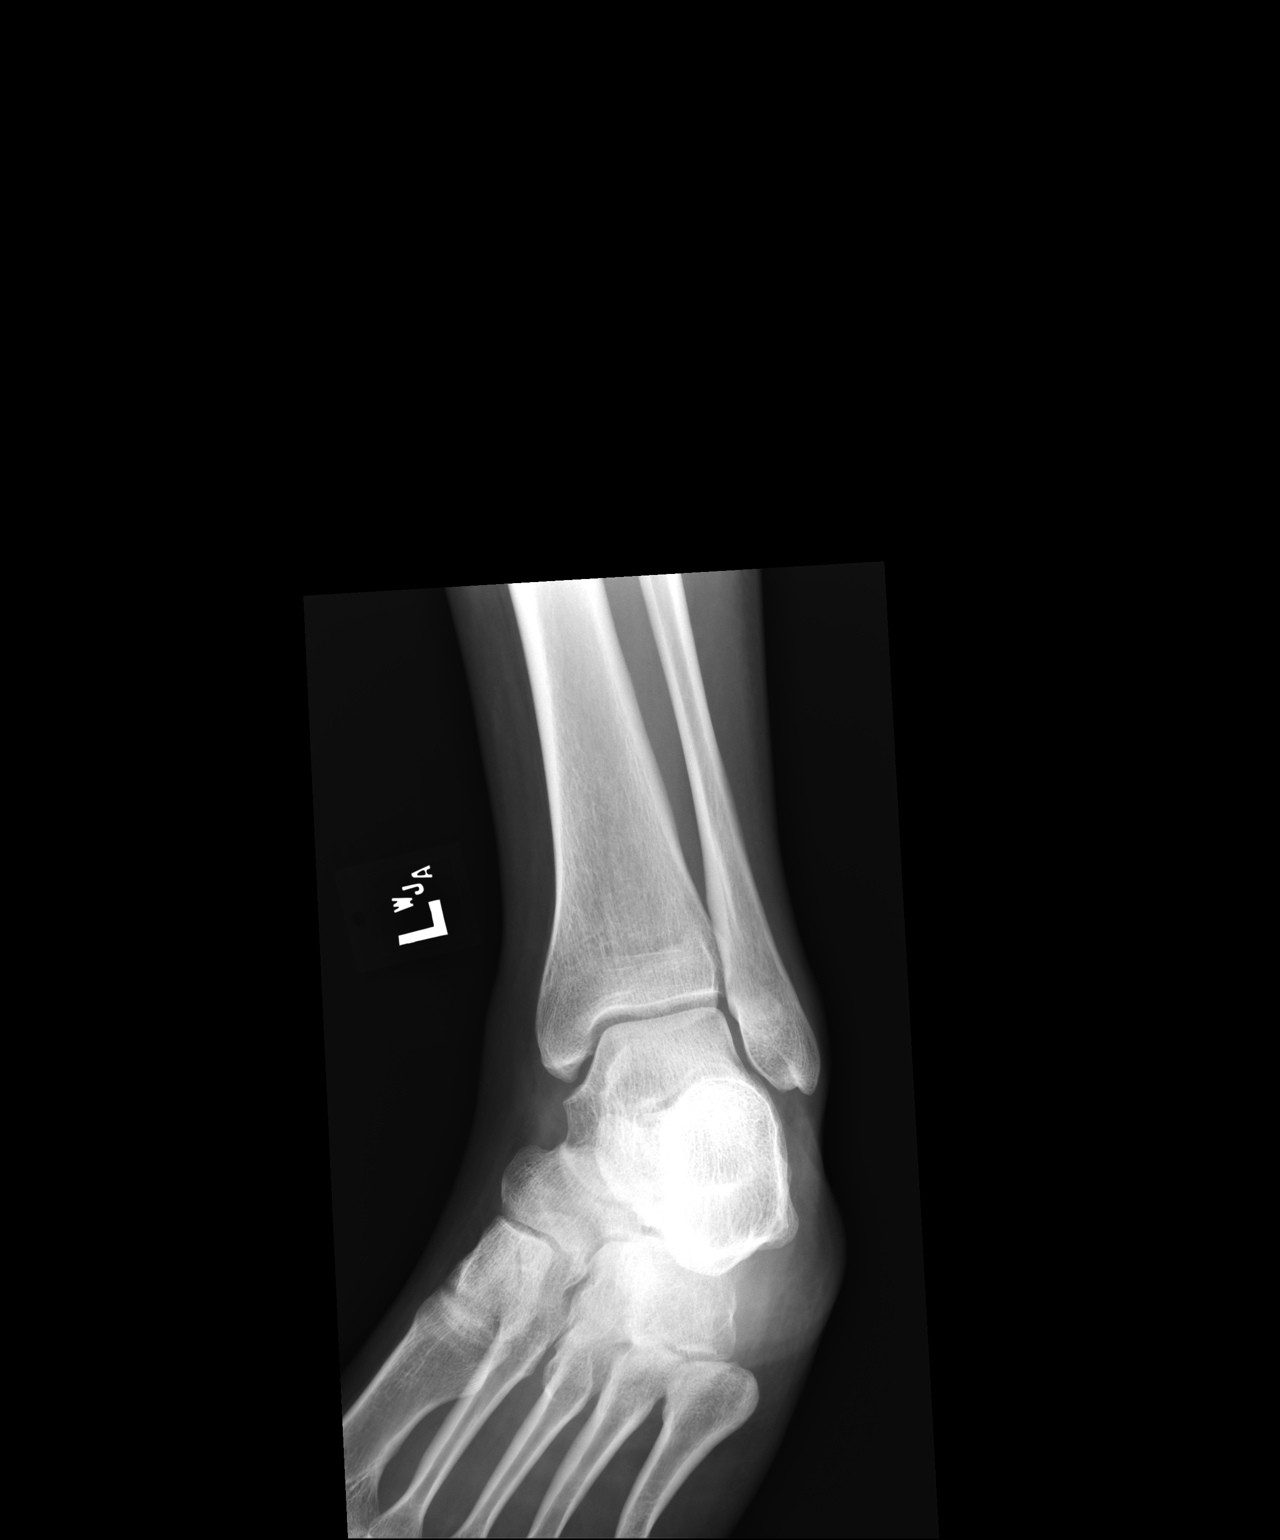

[lateral]
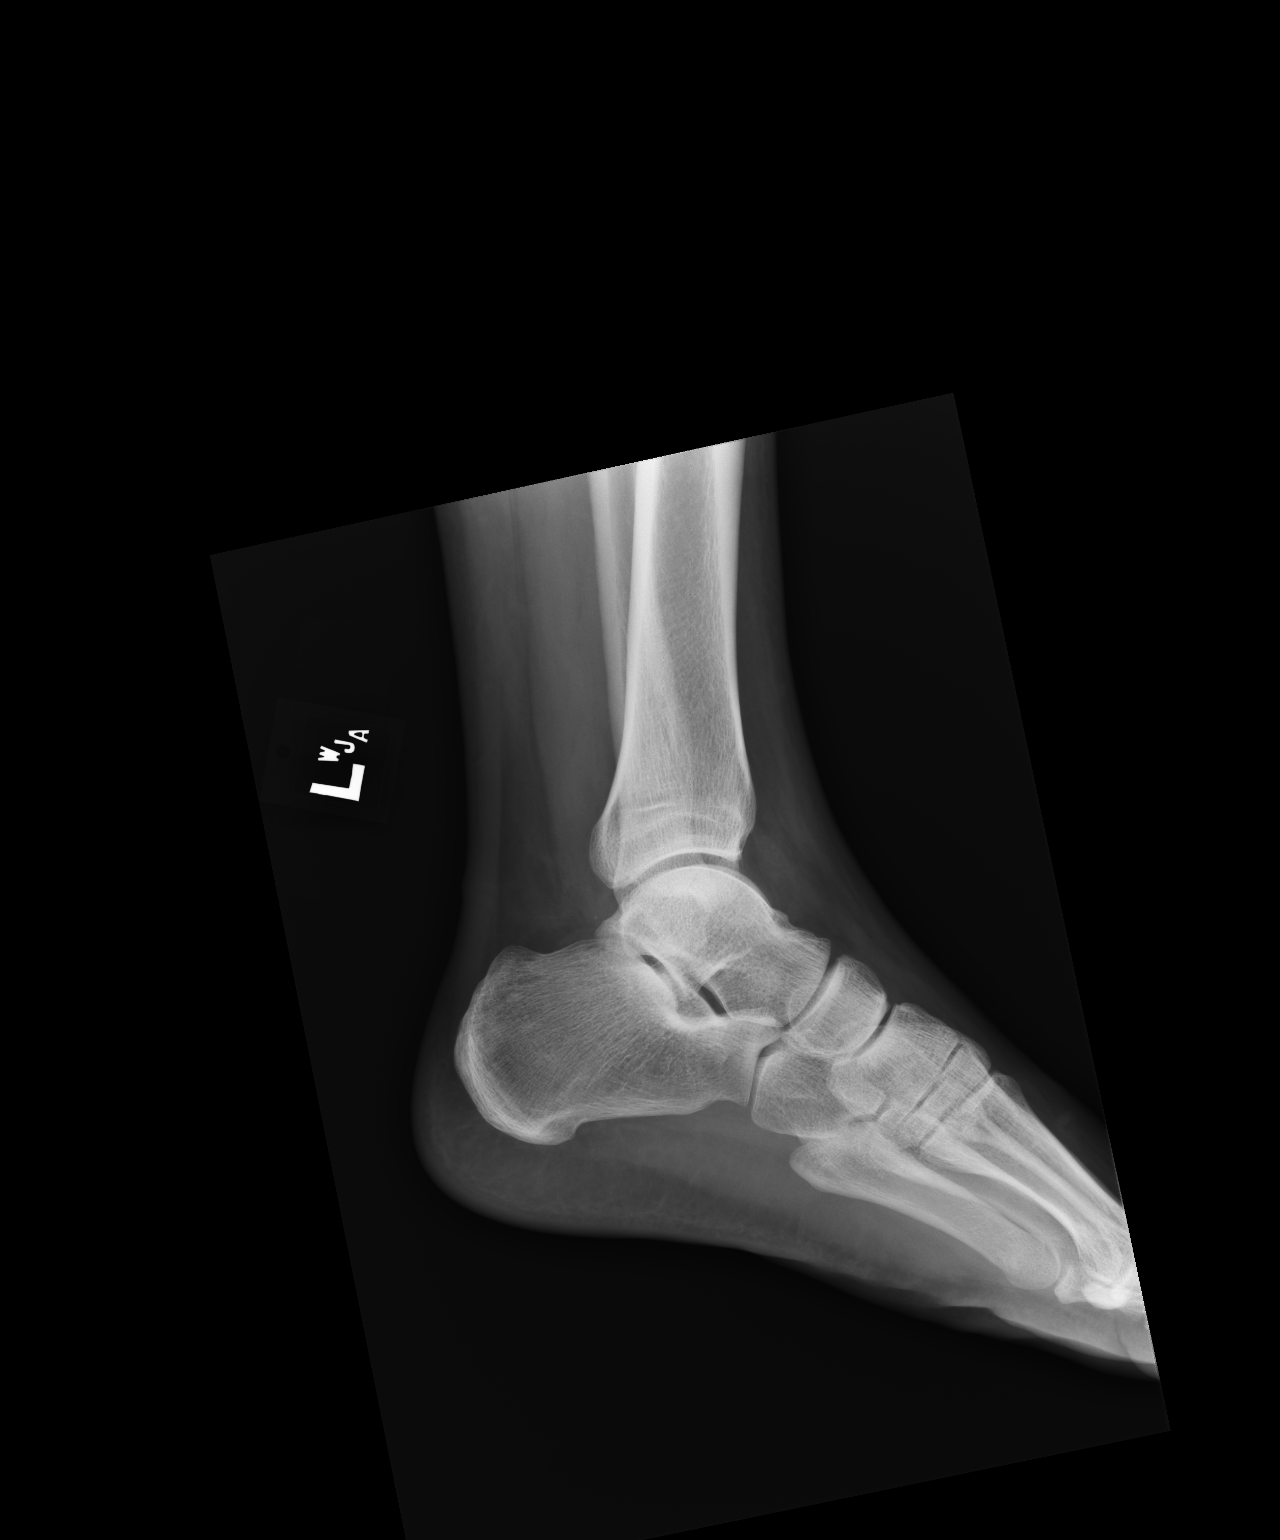

[3 of 3 positions shown; findings below may reference images not displayed]

FINDINGS: There is no evidence of fracture, dislocation, or joint effusion.
There is no evidence of arthropathy or other focal bone abnormality.
Soft tissues are unremarkable.
IMPRESSION: Negative.

## 2018-07-07 ENCOUNTER — Other Ambulatory Visit: Payer: Self-pay

## 2018-07-07 ENCOUNTER — Ambulatory Visit (HOSPITAL_COMMUNITY)
Admission: EM | Admit: 2018-07-07 | Discharge: 2018-07-07 | Disposition: A | Discharge status: Home / Self Care | Payer: Managed Care, Other (non HMO) | Attending: Family Medicine | Admitting: Family Medicine

## 2018-07-07 ENCOUNTER — Encounter (HOSPITAL_COMMUNITY): Payer: Self-pay

## 2018-07-07 DIAGNOSIS — Z760 Encounter for issue of repeat prescription: Secondary | ICD-10-CM

## 2018-07-07 NOTE — ED Triage Notes (Signed)
Pt presents today needing a medication refill on adderall. States original doctors office is closed today. 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 Wants to be switched to vyvanse instead of adderall.

## 2018-07-07 NOTE — ED Provider Notes (Signed)
  [facility]   [id number] 07/07/18 Arrival Time: 1031  ASSESSMENT & PLAN:  1. Encounter for medication refill    Discussed that he will need to f/u with his PCP for refills. 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  Reviewed expectations re: course of current medical issues. Questions answered. Outlined signs and symptoms indicating need for more acute intervention. Patient verbalized understanding. After Visit Summary given.   SUBJECTIVE: History from: patient.  Kyle Vance is a 43 y.o. male who presents requesting medication refill of Vyvanse. 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 Was on this secondary to cost of Vyvanse. His PCP office was not open today but usu are on Saturdays.  Current medical problems include: Past Medical History:  Diagnosis Date  . Depression    ROS: As per HPI.   OBJECTIVE:  Vitals:   07/07/18 1049  BP: 124/81  Pulse: 86  Resp: 16  Temp: (!) 97.4 F (36.3 C)  TempSrc: Oral  SpO2: 97%    General appearance: alert; no distress Eyes: PERRLA; EOMI; conjunctiva normal Lungs: clear to auscultation bilaterally Heart: regular rate and rhythm Back: no CVA tenderness Extremities: no cyanosis or edema; symmetrical with no gross deformities Skin: warm and dry Neurologic: normal gait Psychological: alert and cooperative; normal mood and affect  No Known Allergies  Social History   Socioeconomic History  . Marital status: Single    Spouse name: Not on file  . Number of children: Not on file  . Years of education: Not on file  . Highest education level: Not on file  Occupational History  . Not on file  Social Needs  . Financial resource strain: Not on file  . Food insecurity:    Worry: Not on file    Inability: Not on file  . Transportation needs:    Medical: Not on file    Non-medical: Not on file  Tobacco Use  . Smoking status: Current Every Day  Smoker    Packs/day: 1.00    Types: Cigarettes  . Smokeless tobacco: Never Used  Substance and Sexual Activity  . Alcohol use: Yes    Alcohol/week: 2.0 standard drinks    Types: 2 Cans of beer per week  . Drug use: Yes    Types: Cocaine  . Sexual activity: Not on file  Lifestyle  . Physical activity:    Days per week: Not on file    Minutes per session: Not on file  . Stress: Not on file  Relationships  . Social connections:    Talks on phone: Not on file    Gets together: Not on file    Attends religious service: Not on file    Active member of club or organization: Not on file    Attends meetings of clubs or organizations: Not on file    Relationship status: Not on file  . Intimate partner violence:    Fear of current or ex partner: Not on file    Emotionally abused: Not on file    Physically abused: Not on file    Forced sexual activity: Not on file  Other Topics Concern  . Not on file  Social History Narrative  . Not on file    History reviewed. No pertinent surgical history.   Mardella Layman, MD 07/23/18 [phone]

## 2020-05-08 ENCOUNTER — Emergency Department (HOSPITAL_COMMUNITY)
Admission: EM | Admit: 2020-05-08 | Discharge: 2020-05-10 | Disposition: A | Discharge status: Home / Self Care | Payer: Self-pay | Attending: Emergency Medicine | Admitting: Emergency Medicine

## 2020-05-08 ENCOUNTER — Other Ambulatory Visit: Payer: Self-pay

## 2020-05-08 DIAGNOSIS — F909 Attention-deficit hyperactivity disorder, unspecified type: Secondary | ICD-10-CM | POA: Insufficient documentation

## 2020-05-08 DIAGNOSIS — F22 Delusional disorders: Secondary | ICD-10-CM | POA: Insufficient documentation

## 2020-05-08 DIAGNOSIS — R4589 Other symptoms and signs involving emotional state: Secondary | ICD-10-CM

## 2020-05-08 DIAGNOSIS — F17210 Nicotine dependence, cigarettes, uncomplicated: Secondary | ICD-10-CM | POA: Insufficient documentation

## 2020-05-08 DIAGNOSIS — F419 Anxiety disorder, unspecified: Secondary | ICD-10-CM

## 2020-05-08 DIAGNOSIS — F322 Major depressive disorder, single episode, severe without psychotic features: Secondary | ICD-10-CM | POA: Insufficient documentation

## 2020-05-08 LAB — COMPREHENSIVE METABOLIC PANEL
ALT: 16 U/L (ref 0–44)
AST: 17 U/L (ref 15–41)
Albumin: 4 g/dL (ref 3.5–5.0)
Alkaline Phosphatase: 71 U/L (ref 38–126)
Anion gap: 12 (ref 5–15)
BUN: 7 mg/dL (ref 6–20)
CO2: 24 mmol/L (ref 22–32)
Calcium: 8.8 mg/dL — ABNORMAL LOW (ref 8.9–10.3)
Chloride: 101 mmol/L (ref 98–111)
Creatinine, Ser: 1.08 mg/dL (ref 0.61–1.24)
GFR, Estimated: >60 mL/min (ref >60)
Glucose, Bld: 80 mg/dL (ref 70–99)
Potassium: 3.3 mmol/L — ABNORMAL LOW (ref 3.5–5.1)
Sodium: 137 mmol/L (ref 135–145)
Total Bilirubin: 1.1 mg/dL (ref 0.3–1.2)
Total Protein: 7.1 g/dL (ref 6.5–8.1)

## 2020-05-08 LAB — CBC
HCT: 43.9 % (ref 39.0–52.0)
Hemoglobin: 15 g/dL (ref 13.0–17.0)
MCH: 31.4 pg (ref 26.0–34.0)
MCHC: 34.2 g/dL (ref 30.0–36.0)
MCV: 92 fL (ref 80.0–100.0)
Platelets: 333 10*3/uL (ref 150–400)
RBC: 4.77 MIL/uL (ref 4.22–5.81)
RDW: 13 % (ref 11.5–15.5)
WBC: 9.7 10*3/uL (ref 4.0–10.5)
nRBC: 0 % (ref 0.0–0.2)

## 2020-05-08 LAB — ETHANOL: Alcohol, Ethyl (B): 15 mg/dL — ABNORMAL HIGH (ref <10)

## 2020-05-08 LAB — RAPID URINE DRUG SCREEN, HOSP PERFORMED
Amphetamines: POSITIVE — AB
Barbiturates: NOT DETECTED
Benzodiazepines: NOT DETECTED
Cocaine: NOT DETECTED
Opiates: NOT DETECTED
Tetrahydrocannabinol: NOT DETECTED

## 2020-05-08 LAB — ACETAMINOPHEN LEVEL: Acetaminophen (Tylenol), Serum: <10 ug/mL — ABNORMAL LOW (ref 10–30)

## 2020-05-08 LAB — SALICYLATE LEVEL: Salicylate Lvl: <7 mg/dL — ABNORMAL LOW (ref 7.0–30.0)

## 2020-05-08 MED ORDER — LORAZEPAM 2 MG/ML IJ SOLN: 0.0000 mg | Freq: Four times a day (QID) | INTRAMUSCULAR | Status: DC

## 2020-05-08 MED ORDER — LORAZEPAM 1 MG PO TABS: 0.0000 mg | ORAL_TABLET | Freq: Two times a day (BID) | ORAL | Status: DC

## 2020-05-08 MED ORDER — LORAZEPAM 1 MG PO TABS
0.0000 mg | ORAL_TABLET | Freq: Four times a day (QID) | ORAL | Status: DC
Start: 2020-05-08 — End: 2020-05-10

## 2020-05-08 MED ORDER — THIAMINE HCL 100 MG/ML IJ SOLN: 100.0000 mg | Freq: Every day | INTRAMUSCULAR | Status: DC

## 2020-05-08 MED ORDER — THIAMINE HCL 100 MG PO TABS
100.0000 mg | ORAL_TABLET | Freq: Every day | ORAL | Status: DC
  Filled 2020-05-08 (×2): qty 1

## 2020-05-08 MED ORDER — LORAZEPAM 2 MG/ML IJ SOLN: 0.0000 mg | Freq: Two times a day (BID) | INTRAMUSCULAR | Status: DC
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
## 2020-05-08 NOTE — ED Notes (Signed)
The pt is rambling something about his wife and he do not share the thoughts and beliefs

## 2020-05-08 NOTE — ED Notes (Signed)
Patient's TTS has been completed and returned to [facility] with sitter at [facility]

## 2020-05-08 NOTE — ED Notes (Signed)
Pt refusing covid swab

## 2020-05-08 NOTE — ED Triage Notes (Signed)
Pt BIB [facility], IVC'd by family because patient believes he's seeing messages from God, seeing videos of himself and children being killed. Pt deines SI/HI. Calm and cooperative in triage.

## 2020-05-08 NOTE — ED Notes (Signed)
The pt is calm at the moment  But the pt next to him is upset and mouthy loud

## 2020-05-08 NOTE — ED Notes (Signed)
Patient coming to the front desk and asking why he is here. He stated his wife is doing this because he wants to divorce himn. Denies si/hi at this time. Patient is redirected to bed and given phone to call children.

## 2020-05-08 NOTE — ED Notes (Signed)
The pt is refusing the covid swab  For  HIS RELIGIOUS BELEIFS

## 2020-05-08 NOTE — ED Notes (Signed)
GETTING [facility]SD
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
## 2020-05-08 NOTE — ED Notes (Signed)
Pt talked to wife for 5 minutes via telephone

## 2020-05-08 NOTE — ED Notes (Signed)
ciwa  Score 0

## 2020-05-08 NOTE — ED Notes (Signed)
Dinner Tray Ordered @ 1704. 

## 2020-05-09 MED ORDER — LORAZEPAM 1 MG PO TABS
1.0000 mg | ORAL_TABLET | ORAL | Status: DC | PRN
Start: 2020-05-09 — End: 2020-05-10
  Filled 2020-05-09: qty 1

## 2020-05-09 MED ORDER — ZIPRASIDONE MESYLATE 20 MG IM SOLR: 20.0000 mg | INTRAMUSCULAR | Status: DC | PRN

## 2020-05-09 MED ORDER — STERILE WATER FOR INJECTION IJ SOLN
INTRAMUSCULAR | Status: AC
  Filled 2020-05-09: qty 10

## 2020-05-09 MED ORDER — SERTRALINE HCL 25 MG PO TABS
25.0000 mg | ORAL_TABLET | Freq: Every day | ORAL | Status: DC
  Filled 2020-05-09: qty 1

## 2020-05-09 MED ORDER — SERTRALINE HCL 25 MG PO TABS: 25.0000 mg | ORAL_TABLET | Freq: Every day | ORAL | Status: DC

## 2020-05-09 MED ORDER — ZIPRASIDONE MESYLATE 20 MG IM SOLR
20.0000 mg | Freq: Once | INTRAMUSCULAR | Status: AC
  Administered 2020-05-09: 20 mg via INTRAMUSCULAR
  Filled 2020-05-09: qty 20

## 2020-05-09 MED ORDER — RISPERIDONE 2 MG PO TBDP: 2.0000 mg | ORAL_TABLET | Freq: Three times a day (TID) | ORAL | Status: DC | PRN

## 2020-05-09 NOTE — ED Notes (Signed)
Pt talking to TTS 

## 2020-05-09 NOTE — ED Notes (Signed)
Pt notified of disposition. Pt agitated. Pt refusing all labs saying, "I don't give a fuck. I'm going to let everything crumble on the outside like I am crumbling in here on the inside."

## 2020-05-09 NOTE — ED Notes (Signed)
Pt sitting on the stretcher now not yelling as much. Security at bedside

## 2020-05-09 NOTE — ED Notes (Signed)
This RN w/ [employer] and security at bedside administered Geodon IM in the left thigh as [employer] secured pt down on the bed without struggle.

## 2020-05-09 NOTE — ED Notes (Addendum)
Pt agitated, and rambling about stuff that doesn't make sense in the doorway of his room. Pt wants to know his rights and the process of everything. Tried to explain to pt that we have policies and procedures to work through. Pt wants the information in writing. Security paged to help deescalate situation. Male Sitter talking to pt to explain process. Pt is receptive to talking to sitter.

## 2020-05-09 NOTE — ED Provider Notes (Signed)
Patient sent here for hallucination, paranoia and unusual behavior.  He would need to be assessed by psychiatry.  Nurse notified that patient is becoming increasingly agitated.  Security is at bedside with attempt to verbally de-escalate.  His EKG shows a normal QTC therefore, will give 20mg  of Geodon.   , PA-C 05/09/20 1548    07/07/20, MD 05/10/20 2033

## 2020-05-09 NOTE — ED Notes (Signed)
Pt now laying down on stretcher and resting.

## 2020-05-09 NOTE — ED Notes (Signed)
Violence Safety Assessment: Notified security. See nursing notes.

## 2020-05-09 NOTE — ED Notes (Signed)
Pt agitated and yelling from stretcher at security and [employer]

## 2020-05-09 NOTE — ED Notes (Signed)
Pt refusing V/S. Denies N/V, sweating.

## 2020-05-09 NOTE — Progress Notes (Signed)
Per Melbourne Abts PA-C this Pt meets criteria for inpatient treatment.   Pt is under IVC.  Pt was referred to the following facilities for admission:    [facility]        [facility]        [facility]        [facility]        [facility]        [facility]        [facility]        [facility]        [facility]        [facility]        [facility]        [facility]        [facility]        [facility]        [facility]        [facility]        [facility]        [facility]        [facility]        [facility]        [facility]        [facility]        [facility]        [facility]        [facility]        [facility]   Jacinta Shoe, [place] Clinical Social Worker - Disposition [phone]

## 2020-05-09 NOTE — BH Assessment (Signed)
TTS REASSESSMENT: Pt unavailable for TTS assessment at this time. Pt's RN states she will notify TTS when pt is ready.
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
## 2020-05-09 NOTE — ED Notes (Signed)
Pt's sister Kyle Vance: cell phone [phone], home phone [phone]

## 2020-05-09 NOTE — ED Notes (Signed)
Pt refusing all blood work and Covid swab. Does not "want anything done" "If my sister thinks I'm crazy, Ill just stay here and let the bills pile up"

## 2020-05-09 NOTE — ED Notes (Signed)
Pt has been sleeping all morn ing. This nurse went him and awakened for labs.

## 2020-05-09 NOTE — ED Notes (Signed)
TTS at bedside. Pt agreeable to talk to TTS person at this time.

## 2020-05-09 NOTE — ED Notes (Addendum)
Security at bedside. Pt telling them he wants literature of his rights. Security explaining the [facility] process and what his rights as a pt are. [employer] officer at bedside as well.

## 2020-05-10 LAB — T4, FREE: Free T4: 1.07 ng/dL (ref 0.61–1.12)

## 2020-05-10 LAB — LITHIUM LEVEL: Lithium Lvl: <0.06 mmol/L — ABNORMAL LOW (ref 0.60–1.20)

## 2020-05-10 LAB — TSH: TSH: 0.833 u[IU]/mL (ref 0.350–4.500)

## 2020-05-10 NOTE — ED Notes (Signed)
Patient refusing covid swab test. Blood work collected

## 2020-05-10 NOTE — ED Notes (Signed)
Patient given discharge instructions. Questions were answered. Patient verbalized understanding of discharge instructions and care at home.  Patient belongings returned (clothing and valuables).

## 2020-05-10 NOTE — Consult Note (Signed)
Telepsych Consultation   Reason for Consult:  Paranoid behavior Referring Physician:  Dr Corliss Blacker Location of Patient: [facility] Location of Provider: [facility]  Patient Identification: Kyle Vance MRN:  [id number] Principal Diagnosis: <principal problem not specified> Diagnosis:  Active Problems:   MDD (major depressive disorder), single episode, severe , no psychosis (HCC)   Total Time spent with patient: 30 minutes  Subjective:   GRABIEL Vance is a 45 y.o. male patient admitted with paranoid behavior.  HPI:  Kyle Vance is a 45 year old male who presented to the [facility], via [facility], after his daughter took out an IVC petition for paranoid behavior. He has a psychiatric history significant for anxiety, depression, reported history of ADHD and borderline personality disorder. He works as a Naval architect, drives at night, and lives with his son and daughter. He is currently going through a divorce. He recently stopped taking his Zoloft, which he had been on for 5 years. He stated he always wanted to take himself off of it and weaned down until he stopped. He denies drug use, his UDS was positive for amphetamines, which he stated he took some of his left over Adderalll 2 days prior to admission. He stated he occasionally drinks wine to help him sleep, ethanol was 15 on admission.   Today, patient is seen face to face, via telepsych, chart reviewed and case discussed with Dr Lucianne Muss. Patient is laying on his stretcher and eating his breakfast. He is calm ands cooperative and answers questions appropriately. He spent the night in the emergency room and this morning stated he slept well. He stated he normally sleeps well and has a good appetite. He denies suicidal and homicidal ideation, plan or intent. He denies access to weapons. He denies auditory and visual hallucinations. He stated "I have beliefs that other people don't have and sometimes they think I am crazy." I have  never tried to harm myself. Patient is able to contract for safety. CSW has placed [facility] information in patient's chart and he agrees to follow up and establish care with a PCP. Patient is recommended for discharge home.   Past Psychiatric History: Anxiety, depression. Self-reported ADHD, and borderline personality disorder.   Risk to Self:   Risk to Others:   Prior Inpatient Therapy:   Prior Outpatient Therapy:    Past Medical History:  Past Medical History:  Diagnosis Date  . Depression    No past surgical history on file. Family History: No family history on file. Family Psychiatric  History: Unknown Social History:  Social History   Substance and Sexual Activity  Alcohol Use Yes  . Alcohol/week: 2.0 standard drinks  . Types: 2 Cans of beer per week     Social History   Substance and Sexual Activity  Drug Use Yes  . Types: Cocaine    Social History   Socioeconomic History  . Marital status: Single    Spouse name: Not on file  . Number of children: Not on file  . Years of education: Not on file  . Highest education level: Not on file  Occupational History  . Not on file  Tobacco Use  . Smoking status: Current Every Day Smoker    Packs/day: 1.00    Types: Cigarettes  . Smokeless tobacco: Never Used  Substance and Sexual Activity  . Alcohol use: Yes    Alcohol/week: 2.0 standard drinks    Types: 2 Cans of beer per week  .  Drug use: Yes    Types: Cocaine  . Sexual activity: Not on file  Other Topics Concern  . Not on file  Social History Narrative  . Not on file   Social Determinants of Health   Financial Resource Strain: Not on file  Food Insecurity: Not on file  Transportation Needs: Not on file  Physical Activity: Not on file  Stress: Not on file  Social Connections: Not on file   Additional Social History:    Allergies:  No Known Allergies  Labs:  Results for orders placed or performed during the hospital encounter  of 05/08/20 (from the past 48 hour(s))  TSH     Status: None   Collection Time: 05/10/20  6:10 AM  Result Value Ref Range   TSH 0.833 0.350 - 4.500 uIU/mL    Comment: Performed by a 3rd Generation assay with a functional sensitivity of <=0.01 uIU/mL. Performed at [facility], [place] [address], [place], [place] [address]   T4, free     Status: None   Collection Time: 05/10/20  6:10 AM  Result Value Ref Range   Free T4 1.07 0.61 - 1.12 ng/dL    Comment: (NOTE) Biotin ingestion may interfere with free T4 tests. If the results are inconsistent with the TSH level, previous test results, or the clinical presentation, then consider biotin interference. If needed, order repeat testing after stopping biotin. Performed at [facility], [place] [address], [place], [place] [address]   Lithium level     Status: Abnormal   Collection Time: 05/10/20  6:10 AM  Result Value Ref Range   Lithium Lvl <0.06 (L) 0.60 - 1.20 mmol/L    Comment: Performed at [facility] [address], [place],  [address]    Medications:  Current Facility-Administered Medications  Medication Dose Route Frequency Provider Last Rate Last Admin  . LORazepam (ATIVAN) injection 0-4 mg  0-4 mg Intravenous O8N Vanna Scotland, MD       Or  . LORazepam (ATIVAN) tablet 0-4 mg  0-4 mg Oral O6V Bishop, Elmo Putt, MD      . Derrill Memo ON 05/11/2020] LORazepam (ATIVAN) injection 0-4 mg  0-4 mg Intravenous E72C Vanna Scotland, MD       Or  . Derrill Memo ON 05/11/2020] LORazepam (ATIVAN) tablet 0-4 mg  0-4 mg Oral N47S Bishop, Elmo Putt, MD      . risperiDONE (RISPERDAL M-TABS) disintegrating tablet 2 mg  2 mg Oral Q8H PRN Ethelene Hal, NP       And  . LORazepam (ATIVAN) tablet 1 mg  1 mg Oral PRN Ethelene Hal, NP       And  . ziprasidone (GEODON) injection 20 mg  20 mg Intramuscular PRN Ethelene Hal, NP      . sertraline (ZOLOFT) tablet 25 mg  25 mg Oral Daily Ethelene Hal, NP      .  thiamine tablet 100 mg  100 mg Oral Daily Vanna Scotland, MD       Or  . thiamine (B-1) injection 100 mg  100 mg Intravenous Daily Vanna Scotland, MD       Current Outpatient Medications  Medication Sig Dispense Refill  . sertraline (ZOLOFT) 100 MG tablet Take 150 mg by mouth daily. Taking 1 & 1/2 tab = 150mg       Musculoskeletal: Strength & Muscle Tone: within normal limits Gait & Station: normal Patient leans: N/A  Psychiatric Specialty Exam: Physical Exam Constitutional:  Appearance: Normal appearance.  HENT:     Head: Normocephalic.  Pulmonary:     Effort: Pulmonary effort is normal.  Musculoskeletal:        General: Normal range of motion.     Cervical back: Normal range of motion.  Neurological:     Mental Status: He is alert and oriented to person, place, and time.  Psychiatric:        Attention and Perception: Attention normal.        Mood and Affect: Mood normal.        Speech: Speech normal.        Behavior: Behavior normal.        Thought Content: Thought content normal.        Cognition and Memory: Cognition normal.        Judgment: Judgment normal.     Review of Systems  Constitutional: Negative for activity change and appetite change.  Respiratory: Negative for chest tightness and shortness of breath.   Cardiovascular: Negative for chest pain.  Gastrointestinal: Negative for abdominal pain.  Neurological: Negative for facial asymmetry and headaches.    Blood pressure (!) 94/57, pulse (!) 57, temperature 97.9 F (36.6 C), temperature source Oral, resp. rate 16, height 5\' 11"  (1.803 m), weight 106.1 kg, SpO2 98 %.Body mass index is 32.62 kg/m.  General Appearance: Casual  Eye Contact:  Good  Speech:  Clear and Coherent and Normal Rate  Volume:  Normal  Mood:  Euthymic  Affect:  Congruent  Thought Process:  Coherent, Goal Directed and Descriptions of Associations: Intact  Orientation:  Full (Time, Place, and Person)  Thought Content:  Logical and  Hallucinations: None  Suicidal Thoughts:  No  Homicidal Thoughts:  No  Memory:  Immediate;   Good Recent;   Good Remote;   Good  Judgement:  Intact  Insight:  Good  Psychomotor Activity:  Normal  Concentration:  Concentration: Good and Attention Span: Good  Recall:  Good  Fund of Knowledge:  Good  Language:  Good  Akathisia:  No  Handed:  Right  AIMS (if indicated):     Assets:  Resilience Social Support Transportation Vocational/Educational  ADL's:  Intact  Cognition:  WNL  Sleep:        Treatment Plan Summary: Plan Discharge Home  Follow up at [facility] to establish care   Disposition: No evidence of imminent risk to self or others at present.   Patient does not meet criteria for psychiatric inpatient admission. Supportive therapy provided about ongoing stressors. Discussed crisis plan, support from social network, calling 911, coming to the Emergency Department, and calling Suicide Hotline.  This service was provided via telemedicine using a 2-way, interactive audio and video technology.  Names of all persons participating in this telemedicine service and their role in this encounter. Name: Carlas Vandyne Role: Patient  Name: Haynes Bast Role: PMHNP  Name:  Role:   Name:  Role:     Elta Guadeloupe, NP 05/10/2020 11:18 AM

## 2020-05-10 NOTE — ED Notes (Signed)
Breakfast Ordered 

## 2020-05-10 NOTE — ED Provider Notes (Addendum)
Emergency Medicine Observation Re-evaluation Note  Kyle Vance is a 45 y.o. male, seen on rounds today.  Pt initially presented to the ED for complaints of IVC.  Pt is currently calm and alert. No hallucinations or delusion. No thoughts of harm to self or others.  Pt is eating/drinking well. Denies any current c/o, other than feeling ready to be d/c from ED.   Physical Exam  BP (!) 94/57 (BP Location: Right Arm)   Pulse (!) 57   Temp 97.9 F (36.6 C) (Oral)   Resp 16   Ht 1.803 m (5\' 11" )   Wt 106.1 kg   SpO2 98%   BMI 32.62 kg/m  Physical Exam General: alert, content, calm. Cardiac: regular rate Lungs: breathing comfortably Psych: alert, calm, conversant. No thoughts of harm to self or others.   ED Course / MDM  EKG:EKG Interpretation  Date/Time:  Friday May 08 2020 16:22:30 EST Ventricular Rate:  57 PR Interval:  138 QRS Duration: 86 QT Interval:  416 QTC Calculation: 404 R Axis:   71 Text Interpretation: Sinus bradycardia with sinus arrhythmia Nonspecific T wave abnormality Abnormal ECG No old tracing to compare Confirmed by [id number] [phone]) on 05/09/2020 2:20:39 PM    I have reviewed the labs performed to date as well as medications administered while in observation.  Recent changes in the last 24 hours include BH team reassessment - BH team indicates patient is psych clear for d/c to home.  Plan  Patient cleared by [facility] team for discharge, indicating is psych clear and outpt referrals provided - see below:  Treatment Plan Summary: Plan Discharge Home  Follow up at [facility] to establish care   Disposition: No evidence of imminent risk to self or others at present.   Patient does not meet criteria for psychiatric inpatient admission. Supportive therapy provided about ongoing stressors. Discussed crisis plan, support from social network, calling 911, coming to the Emergency Department, and calling Suicide Hotline.  This service  was provided via telemedicine using a 2-way, interactive audio and video technology.  Names of all persons participating in this telemedicine service and their role in this encounter. Name: Kyle Vance Role: Patient  Name: Haynes Bast Role: PMHNP  Name:  Role:   Name:  Role:     Elta Guadeloupe, NP 05/10/2020 11:18 AM   Pt currently appears stable for d/c per [facility] plan.   Pt does indicate he is agreeable w d/c plan and that he will follow up as outpatient.   Return precautions provided.       [facility], MD 05/10/20 [phone]

## 2020-05-10 NOTE — Care Management (Signed)
PCP assigned to establish primary care. See patient instructions

## 2020-05-10 NOTE — Discharge Instructions (Addendum)
It was our pleasure to provide your ER care today - we hope that you feel better.  Follow up with primary care doctor and counselor/psychiatry in the next 1-2 weeks - call tomorrow AM to arrange follow up appointment.   For mental health issues and/or crisis, you may go to the [facility], it is open 24/7 and walk-ins are welcome. Go there directly if feeling worse, severe anxiety, worsening depression, or any thoughts of harm to self or others.   Return to ER if worse, new symptoms, trouble breathing, fevers, weak/fainting, or other emergency.
